# Patient Record
Sex: Female | Born: 1975 | Race: Black or African American | Hispanic: No | Marital: Married | Smoking: Current every day smoker
Health system: Southern US, Community
[De-identification: ages and names within clinical notes are randomized; demographics above are authoritative.]

## PROBLEM LIST (undated history)

## (undated) DIAGNOSIS — Z789 Other specified health status: Secondary | ICD-10-CM

## (undated) DIAGNOSIS — I10 Essential (primary) hypertension: Secondary | ICD-10-CM

## (undated) DIAGNOSIS — O1490 Unspecified pre-eclampsia, unspecified trimester: Secondary | ICD-10-CM

## (undated) HISTORY — PX: SALPINGECTOMY: SHX328

## (undated) HISTORY — PX: TUBAL LIGATION: SHX77

---

## 2001-05-13 ENCOUNTER — Other Ambulatory Visit: Admission: RE | Admit: 2001-05-13 | Discharge: 2001-05-13 | Payer: Self-pay | Admitting: Obstetrics and Gynecology

## 2001-05-22 HISTORY — PX: BREAST SURGERY: SHX581

## 2001-08-05 ENCOUNTER — Encounter (INDEPENDENT_AMBULATORY_CARE_PROVIDER_SITE_OTHER): Payer: Self-pay | Admitting: *Deleted

## 2001-08-05 ENCOUNTER — Ambulatory Visit (HOSPITAL_BASED_OUTPATIENT_CLINIC_OR_DEPARTMENT_OTHER): Admission: RE | Admit: 2001-08-05 | Discharge: 2001-08-06 | Payer: Self-pay | Admitting: Specialist

## 2003-09-28 ENCOUNTER — Other Ambulatory Visit: Admission: RE | Admit: 2003-09-28 | Discharge: 2003-09-28 | Payer: Self-pay | Admitting: Obstetrics and Gynecology

## 2003-10-07 ENCOUNTER — Encounter: Admission: RE | Admit: 2003-10-07 | Discharge: 2003-10-07 | Payer: Self-pay | Admitting: Obstetrics and Gynecology

## 2004-01-13 ENCOUNTER — Inpatient Hospital Stay (HOSPITAL_COMMUNITY): Admission: AD | Admit: 2004-01-13 | Discharge: 2004-01-13 | Payer: Self-pay | Admitting: *Deleted

## 2004-02-08 ENCOUNTER — Inpatient Hospital Stay (HOSPITAL_COMMUNITY): Admission: AD | Admit: 2004-02-08 | Discharge: 2004-02-10 | Payer: Self-pay | Admitting: Obstetrics and Gynecology

## 2004-02-15 ENCOUNTER — Observation Stay (HOSPITAL_COMMUNITY): Admission: AD | Admit: 2004-02-15 | Discharge: 2004-02-16 | Payer: Self-pay | Admitting: Obstetrics and Gynecology

## 2004-02-23 ENCOUNTER — Encounter: Admission: RE | Admit: 2004-02-23 | Discharge: 2004-02-23 | Payer: Self-pay | Admitting: Obstetrics and Gynecology

## 2004-02-23 ENCOUNTER — Inpatient Hospital Stay (HOSPITAL_COMMUNITY): Admission: AD | Admit: 2004-02-23 | Discharge: 2004-02-23 | Payer: Self-pay | Admitting: Obstetrics and Gynecology

## 2004-03-29 ENCOUNTER — Inpatient Hospital Stay (HOSPITAL_COMMUNITY): Admission: AD | Admit: 2004-03-29 | Discharge: 2004-03-31 | Payer: Self-pay | Admitting: Obstetrics and Gynecology

## 2004-04-09 ENCOUNTER — Inpatient Hospital Stay (HOSPITAL_COMMUNITY): Admission: AD | Admit: 2004-04-09 | Discharge: 2004-04-09 | Payer: Self-pay | Admitting: Obstetrics and Gynecology

## 2006-02-07 ENCOUNTER — Emergency Department (HOSPITAL_COMMUNITY): Admission: EM | Admit: 2006-02-07 | Discharge: 2006-02-07 | Payer: Self-pay | Admitting: *Deleted

## 2006-03-04 ENCOUNTER — Emergency Department (HOSPITAL_COMMUNITY): Admission: EM | Admit: 2006-03-04 | Discharge: 2006-03-04 | Payer: Self-pay | Admitting: Emergency Medicine

## 2008-11-05 ENCOUNTER — Inpatient Hospital Stay (HOSPITAL_COMMUNITY): Admission: AD | Admit: 2008-11-05 | Discharge: 2008-11-05 | Payer: Self-pay | Admitting: Obstetrics & Gynecology

## 2008-11-07 ENCOUNTER — Inpatient Hospital Stay (HOSPITAL_COMMUNITY): Admission: AD | Admit: 2008-11-07 | Discharge: 2008-11-07 | Payer: Self-pay | Admitting: Obstetrics & Gynecology

## 2008-11-13 ENCOUNTER — Inpatient Hospital Stay (HOSPITAL_COMMUNITY): Admission: RE | Admit: 2008-11-13 | Discharge: 2008-11-13 | Payer: Self-pay | Admitting: Obstetrics & Gynecology

## 2008-11-13 ENCOUNTER — Ambulatory Visit: Payer: Self-pay | Admitting: Obstetrics and Gynecology

## 2008-11-16 ENCOUNTER — Inpatient Hospital Stay (HOSPITAL_COMMUNITY): Admission: AD | Admit: 2008-11-16 | Discharge: 2008-11-16 | Payer: Self-pay | Admitting: Obstetrics & Gynecology

## 2008-11-19 ENCOUNTER — Encounter: Payer: Self-pay | Admitting: Obstetrics & Gynecology

## 2008-11-19 ENCOUNTER — Ambulatory Visit: Payer: Self-pay | Admitting: Obstetrics & Gynecology

## 2008-11-19 ENCOUNTER — Ambulatory Visit (HOSPITAL_COMMUNITY): Admission: AD | Admit: 2008-11-19 | Discharge: 2008-11-19 | Payer: Self-pay | Admitting: Obstetrics & Gynecology

## 2008-12-10 ENCOUNTER — Ambulatory Visit: Payer: Self-pay | Admitting: Obstetrics and Gynecology

## 2009-03-24 ENCOUNTER — Ambulatory Visit (HOSPITAL_COMMUNITY): Admission: RE | Admit: 2009-03-24 | Discharge: 2009-03-24 | Payer: Self-pay | Admitting: Obstetrics

## 2009-04-23 ENCOUNTER — Ambulatory Visit (HOSPITAL_COMMUNITY): Admission: RE | Admit: 2009-04-23 | Discharge: 2009-04-23 | Payer: Self-pay | Admitting: Obstetrics & Gynecology

## 2009-05-19 ENCOUNTER — Encounter: Admission: RE | Admit: 2009-05-19 | Discharge: 2009-08-17 | Payer: Self-pay | Admitting: Obstetrics

## 2009-05-20 ENCOUNTER — Ambulatory Visit (HOSPITAL_COMMUNITY): Admission: RE | Admit: 2009-05-20 | Discharge: 2009-05-20 | Payer: Self-pay | Admitting: Obstetrics & Gynecology

## 2009-06-04 ENCOUNTER — Ambulatory Visit (HOSPITAL_COMMUNITY): Admission: RE | Admit: 2009-06-04 | Discharge: 2009-06-04 | Payer: Self-pay | Admitting: Obstetrics & Gynecology

## 2009-07-16 ENCOUNTER — Ambulatory Visit (HOSPITAL_COMMUNITY): Admission: RE | Admit: 2009-07-16 | Discharge: 2009-07-16 | Payer: Self-pay | Admitting: Obstetrics & Gynecology

## 2009-08-13 ENCOUNTER — Ambulatory Visit (HOSPITAL_COMMUNITY): Admission: RE | Admit: 2009-08-13 | Discharge: 2009-08-13 | Payer: Self-pay | Admitting: Obstetrics & Gynecology

## 2009-09-04 ENCOUNTER — Inpatient Hospital Stay (HOSPITAL_COMMUNITY): Admission: AD | Admit: 2009-09-04 | Discharge: 2009-09-04 | Payer: Self-pay | Admitting: Obstetrics

## 2009-09-09 ENCOUNTER — Ambulatory Visit (HOSPITAL_COMMUNITY): Admission: RE | Admit: 2009-09-09 | Discharge: 2009-09-09 | Payer: Self-pay | Admitting: Obstetrics & Gynecology

## 2009-10-07 ENCOUNTER — Ambulatory Visit (HOSPITAL_COMMUNITY): Admission: RE | Admit: 2009-10-07 | Discharge: 2009-10-07 | Payer: Self-pay | Admitting: Obstetrics & Gynecology

## 2009-10-10 ENCOUNTER — Inpatient Hospital Stay (HOSPITAL_COMMUNITY): Admission: AD | Admit: 2009-10-10 | Discharge: 2009-10-10 | Payer: Self-pay | Admitting: Obstetrics & Gynecology

## 2009-10-12 ENCOUNTER — Ambulatory Visit: Payer: Self-pay | Admitting: Family Medicine

## 2009-10-13 ENCOUNTER — Encounter: Payer: Self-pay | Admitting: Family Medicine

## 2009-10-13 LAB — CONVERTED CEMR LAB: Chlamydia, DNA Probe: NEGATIVE

## 2009-10-20 ENCOUNTER — Ambulatory Visit: Payer: Self-pay | Admitting: Obstetrics & Gynecology

## 2009-10-22 ENCOUNTER — Ambulatory Visit: Payer: Self-pay | Admitting: Obstetrics and Gynecology

## 2009-10-23 ENCOUNTER — Inpatient Hospital Stay (HOSPITAL_COMMUNITY): Admission: AD | Admit: 2009-10-23 | Discharge: 2009-10-23 | Payer: Self-pay | Admitting: Obstetrics and Gynecology

## 2009-10-23 ENCOUNTER — Ambulatory Visit: Payer: Self-pay | Admitting: Advanced Practice Midwife

## 2009-10-25 ENCOUNTER — Ambulatory Visit: Payer: Self-pay | Admitting: Obstetrics & Gynecology

## 2009-10-27 ENCOUNTER — Inpatient Hospital Stay (HOSPITAL_COMMUNITY): Admission: RE | Admit: 2009-10-27 | Discharge: 2009-10-29 | Payer: Self-pay | Admitting: Obstetrics & Gynecology

## 2009-10-27 ENCOUNTER — Ambulatory Visit: Payer: Self-pay | Admitting: Advanced Practice Midwife

## 2009-12-21 ENCOUNTER — Ambulatory Visit: Payer: Self-pay | Admitting: Obstetrics & Gynecology

## 2010-03-25 ENCOUNTER — Ambulatory Visit: Payer: Self-pay | Admitting: Obstetrics & Gynecology

## 2010-03-25 ENCOUNTER — Ambulatory Visit (HOSPITAL_COMMUNITY)
Admission: RE | Admit: 2010-03-25 | Discharge: 2010-03-25 | Payer: Self-pay | Source: Home / Self Care | Admitting: Obstetrics & Gynecology

## 2010-06-12 ENCOUNTER — Encounter: Payer: Self-pay | Admitting: Obstetrics

## 2010-08-03 LAB — CBC
Hemoglobin: 12.9 g/dL (ref 12.0–15.0)
MCHC: 33.5 g/dL (ref 30.0–36.0)
RBC: 4.76 MIL/uL (ref 3.87–5.11)

## 2010-08-03 LAB — SURGICAL PCR SCREEN
MRSA, PCR: NEGATIVE
Staphylococcus aureus: NEGATIVE

## 2010-08-08 LAB — GLUCOSE, CAPILLARY
Glucose-Capillary: 144 mg/dL — ABNORMAL HIGH (ref 70–99)
Glucose-Capillary: 80 mg/dL (ref 70–99)
Glucose-Capillary: 92 mg/dL (ref 70–99)

## 2010-08-08 LAB — CBC
HCT: 35.5 % — ABNORMAL LOW (ref 36.0–46.0)
Hemoglobin: 12.2 g/dL (ref 12.0–15.0)
MCHC: 34.4 g/dL (ref 30.0–36.0)
MCV: 79.1 fL (ref 78.0–100.0)
RBC: 4.49 MIL/uL (ref 3.87–5.11)

## 2010-08-09 LAB — GLUCOSE, CAPILLARY: Glucose-Capillary: 92 mg/dL (ref 70–99)

## 2010-08-09 LAB — URINALYSIS, ROUTINE W REFLEX MICROSCOPIC
Glucose, UA: NEGATIVE mg/dL
Leukocytes, UA: NEGATIVE
Nitrite: NEGATIVE
Protein, ur: NEGATIVE mg/dL
pH: 6 (ref 5.0–8.0)

## 2010-08-09 LAB — URINE MICROSCOPIC-ADD ON

## 2010-08-28 LAB — CBC
HCT: 34.8 % — ABNORMAL LOW (ref 36.0–46.0)
Hemoglobin: 12 g/dL (ref 12.0–15.0)
RBC: 4.32 MIL/uL (ref 3.87–5.11)
RDW: 13.1 % (ref 11.5–15.5)

## 2010-08-28 LAB — HCG, QUANTITATIVE, PREGNANCY: hCG, Beta Chain, Quant, S: 6218 m[IU]/mL — ABNORMAL HIGH (ref <5)

## 2010-08-28 LAB — TYPE AND SCREEN: Antibody Screen: NEGATIVE

## 2010-08-28 LAB — ABO/RH: ABO/RH(D): O POS

## 2010-08-29 LAB — COMPREHENSIVE METABOLIC PANEL
AST: 23 U/L (ref 0–37)
Albumin: 3.6 g/dL (ref 3.5–5.2)
Chloride: 105 mEq/L (ref 96–112)
Creatinine, Ser: 0.61 mg/dL (ref 0.4–1.2)
GFR calc Af Amer: >60 mL/min (ref >60)
Potassium: 3.6 mEq/L (ref 3.5–5.1)
Total Bilirubin: 0.4 mg/dL (ref 0.3–1.2)
Total Protein: 7.4 g/dL (ref 6.0–8.3)

## 2010-08-29 LAB — URINALYSIS, ROUTINE W REFLEX MICROSCOPIC
Bilirubin Urine: NEGATIVE
Ketones, ur: NEGATIVE mg/dL
Protein, ur: NEGATIVE mg/dL
Urobilinogen, UA: 0.2 mg/dL (ref 0.0–1.0)

## 2010-08-29 LAB — URINE CULTURE: Colony Count: 1000

## 2010-08-29 LAB — CBC
Hemoglobin: 13 g/dL (ref 12.0–15.0)
MCHC: 34.2 g/dL (ref 30.0–36.0)
MCV: 80.9 fL (ref 78.0–100.0)
Platelets: 307 10*3/uL (ref 150–400)
RBC: 4.72 MIL/uL (ref 3.87–5.11)
RDW: 12.9 % (ref 11.5–15.5)
WBC: 11.3 10*3/uL — ABNORMAL HIGH (ref 4.0–10.5)
WBC: 9.9 10*3/uL (ref 4.0–10.5)

## 2010-08-29 LAB — DIFFERENTIAL
Basophils Absolute: 0.2 10*3/uL — ABNORMAL HIGH (ref 0.0–0.1)
Eosinophils Relative: 2 % (ref 0–5)
Lymphocytes Relative: 22 % (ref 12–46)
Monocytes Absolute: 0.8 10*3/uL (ref 0.1–1.0)
Monocytes Relative: 7 % (ref 3–12)

## 2010-08-29 LAB — WET PREP, GENITAL

## 2010-08-29 LAB — HCG, QUANTITATIVE, PREGNANCY: hCG, Beta Chain, Quant, S: 4922 m[IU]/mL — ABNORMAL HIGH (ref <5)

## 2010-10-04 NOTE — Group Therapy Note (Signed)
[name], BOULET NO.:  [id number]   MEDICAL RECORD NO.:  [id number]          PATIENT TYPE:  WOC   LOCATION:  [facility]                   FACILITY:  [facility]   PHYSICIAN:  Scheryl Darter, MD       DATE OF BIRTH:  [birth year]   DATE OF SERVICE:                                  CLINIC NOTE   The patient comes for followup after a laparoscopy and left  salpingectomy for ruptured left tubal ectopic pregnancy on November 19, 2008.  Procedure was performed by Dr. Macon Large.  Pathology shows a luminal  chorionic villi consistent with tubal ectopic pregnancy on the left.  The patient had had a Mirena in place for 4 years.  She had previously  been treated with methotrexate for ectopic pregnancy.  The IUD was  removed at the time of the procedure.  It was found to have been  embedded in the cervical canal.  She would like to be on oral  contraceptives now for birth control.  She occasionally smokes a  cigarette.  No history of venous thrombosis.   PHYSICAL EXAMINATION:  GENERAL:  The patient is in no acute distress  with normal affect.  ABDOMEN:  Moderately obese with well healing incisions at the umbilicus  in the left lower quadrant and the abdomen is nontender.  PELVIC:  Deferred.   IMPRESSION:  The patient is doing well after surgery for left tubal  ectopic pregnancy.   PLAN:  Gave prescription for Ortho Tri-Cyclen Lo.  She may follow up at  [facility] after this.      Scheryl Darter, MD     JA/MEDQ  D:  12/10/2008  T:  12/11/2008  Job:  [id number]

## 2010-10-04 NOTE — Op Note (Signed)
[name], [name]               ACCOUNT NO.:  [id number]   MEDICAL RECORD NO.:  [id number]          PATIENT TYPE:  AMB   LOCATION:  [facility]                          [facility]   PHYSICIAN:  Horton Chin, MD DATE OF BIRTH:  March 10, 1976   DATE OF PROCEDURE:  11/19/2008  DATE OF DISCHARGE:  11/19/2008                               OPERATIVE REPORT   PREOPERATIVE DIAGNOSIS:  Ectopic pregnancy, responsive to methotrexate  therapy, retained intrauterine device.   POSTOPERATIVE DIAGNOSIS:  Ruptured ectopic pregnancy in left fallopian  tube, removed intrauterine device.   PROCEDURE:  Laparoscopic right salpingectomy.   SURGEON:  Horton Chin, MD   ASSISTANT:  Carmin Richmond, DO   ANESTHESIA:  General.   IV FLUIDS:  2600 mL.   ESTIMATED BLOOD LOSS:  Minimal during procedure.  Of note,  hemoperitoneum was about 150 mL.   FINDINGS:  Ruptured left fallopian tube with ectopic pregnancy, normal  uterus, and right adnexa.   SPECIMENS:  Left fallopian tube and ectopic pregnancy sent to pathology.   COMPLICATIONS:  None immediate.   PROCEDURE IN DETAIL:  The patient was a 35 year old gravida 6, para 1-0-  4-1 with known ectopic pregnancy who was treated with methotrexate on  November 13, 2008.  The patient demonstrated an increase in her hCG from  4922 on day one to a value of 6218 on day seven.  She also presented on  day 7 with severe abdominal pain and an ultrasound showed an enlarged  left ectopic pregnancy that increased from 1.2 cm on day 1 to about 3.5  cm and some complex fluid in the pelvis.  Given the patient's symptoms,  she was counseled regarding surgical management and the risks of surgery  including bleeding, infection, injury to surrounding organs, need for  additional procedures including laparotomy were explained to the patient  and written informed consent was obtained.  She was taken from the MAU  to the operating room where general anesthesia was  administered and  found to be adequate.  The patient was then placed in a dorsal lithotomy  position and prepped and draped in a sterile manner.  Her bladder was  catheterized for an unmeasured amount of clear yellow urine.  Of note,  the patient had a retained Mirena IUD in place and on ultrasound, it was  noted to be in her cervix embedded in the cervical canal.  While the  patient was being prepped and draped in a sterile manner,  the IUD  string was easily visualized in the cervical canal and was removed at  this point during the prep.  No additional procedures needed to be done  to remove the IUD at this point and it was removed intact.  The uterine  manipulator was then placed at this point.  Attention was then turned to  the patient's abdomen where an umbilical incision was made and a Veress  needle was inserted into the peritoneum.  Correct intraperitoneal  placement was observed upon insufflation with carbon dioxide gas with an  opening pressure of 3 mmHg.  The abdomen was insufflated  to 15 mmHg, and  the Veress needle was removed.  A 10-mm trocar was then placed through  an umbilical site and the laparoscope was placed.  With the laparoscope,  a complete survey of the abdomen was done and left ruptured ectopic  pregnancy was noted.  At this point, 2 left lower quadrant ports were  placed; one was 10 mm and the other one was a 5 mm.  Through these  ports, a grasper and the gyrus instrument were used to isolate the left  fallopian tube containing the ectopic pregnancy and the gyrus instrument  was used to lyse the attachment of the tube to the uterus and the  underlying mesosalpinx.  Small amount of bleeding was observed and was  controlled using the gyrus instrument.  The pelvis was then copiously  irrigated with normal saline and hemostasis was reconfirmed on all  surfaces.  The ectopic pregnancy and the fallopian tube were removed  from the abdomen intact using an EndoCatch bag.   Hemostasis was then  confirmed on all surfaces and the abdomen was desufflated.  The 10-mm  fascial sites were reapproximated using 0 Vicryl figure-of-eight  stitches and all skin incisions were closed using 4-0 Vicryl  subcuticular stitches.  The patient tolerated the procedure well.  Sponge, needle, and instrument counts were correct x3.  She was taken to  the recovery room in stable condition.      Horton Chin, MD  Electronically Signed     UAA/MEDQ  D:  11/19/2008  T:  11/20/2008  Job:  [id number]

## 2010-10-07 NOTE — Discharge Summary (Signed)
[name], [name]               ACCOUNT NO.:  [id number]   MEDICAL RECORD NO.:  [id number]          PATIENT TYPE:  INP   LOCATION:  9155                           FACILITY:   PHYSICIAN:  Osborn Coho, M.D.   DATE OF BIRTH:  Oct 20, 1975   DATE OF ADMISSION:  02/15/2004  DATE OF DISCHARGE:  02/16/2004                                 DISCHARGE SUMMARY   ADMISSION DIAGNOSES:  1.  Intrauterine pregnancy at 32-1/7 weeks.  2.  Gestational diabetes.  3.  Obesity.  4.  History of preterm uterine contractions.   DISCHARGE DIAGNOSES:  1.  Leaving Against Medical Advice.  2.  Poor diabetic control.   PROCEDURE:  This admission are none.   HOSPITAL COURSE:  Ms. [name] is a 35 year old black female, gravida 2, para  0-0-1-0 at 32-1/7 weeks who presented to the office yesterday for her  routine exam and was noted to have glycosuria whereupon a random blood sugar  was done that was greater than 300.  She was then admitted to [facility] for diabetic teaching and glucose management and insulin  management.  She was started on four units of NPH at night and 2 units of  NPH in the a.m. and her fasting blood sugar this morning was 127.  Her two  hours PC's have varied between 119 and 205 and her two hours after breakfast  today was 122.  She has been recommended to continue her hospitalization  until the sugars are more adequately controlled; however, the patient states  that she must leave the hospital to take care of some issues at home and  will not stay but has agreed to stay for further teaching.  The plan then is  that she be discharged home on insulin therapy and to continue to give  herself NPH 6 units at bedtime and 2 units every morning of NPH and she is  currently being instructed regarding the administration of insulin.   DISCHARGE INSTRUCTIONS:  She is to call for signs or symptoms of preterm  labor, decreased fetal movement or any issues regarding her sugars.   DISCHARGE  MEDICATIONS:  1.  NPH insulin 6 units q h.s. and NPH 2 units q.a.m.  2.  Prenatal vitamins daily.   DISCHARGE LABS:  Her urinalysis was negative except for 500 glucose and a  small amount of LE.  Her comprehensive metabolic panel is essentially within  normal limits and her CBC is with a hemoglobin of 12.1, WBC count of 13.1,  and platelets 254.  TSH was 1.79.   DISCHARGE FOLLOW UP:  Will be on Friday for an NST and then Tuesdays and  Fridays will be for NSTs subsequently with a visit with a medical doctor on  Tuesday.   DISCHARGE STATUS:  Well and relatively stable although not recommended for  discharge.      SJD/MEDQ  D:  02/16/2004  T:  02/16/2004  Job:  [id number]

## 2010-10-07 NOTE — H&P (Signed)
[name], [name]               ACCOUNT NO.:  [id number]   MEDICAL RECORD NO.:  [id number]          PATIENT TYPE:  INP   LOCATION:  9143                          [facility]   PHYSICIAN:  Osborn Coho, M.D.   DATE OF BIRTH:  [birth year]   DATE OF ADMISSION:  03/29/2004  DATE OF DISCHARGE:                                HISTORY & PHYSICAL   Ms. [name] is a 35 year old, single, black female, gravida 2, para 0-0-1-0  at 47 3/7 weeks who presented to maternity admissions by EMS complaining of  uterine contractions since about 5 a.m. after her water broke. She reported  clear fluid upon noting her water to be broken. She reported she had been  about 3-4 cm late in her pregnancy and when she presented she was found to  be completed dilated. She denied any nausea, vomiting, headache or visual  disturbances.  Her pregnancy has been followed by [facility],  initially by the Certified Nurse Midwife Service  but transferred to the MD  service secondary to gestational diabetes requiring insulin.  She has been  under moderately good control with NPH at bedtime.  Her other pregnancy  complications were a history of positive group B strep in her urine with  this pregnancy, history of abnormal Pap.   OB/GYN HISTORY:  She is a gravida 2, para 0-0-1-0 with an elective AB in  December of 2004 without complications.  Her LMP for this pregnancy was  February of 2005 giving her an [facility] of April 14, 2004 and by ultrasound  November 19.  She has had Depo-Provera shots, the last one was in 2000. She  had an abnormal Pap in 1999 and had a colposcopy and her last Pap was in  2002 and was within normal limits.  She had a history of chlamydia and  trichomonas in the past both of which have been treated.   PAST MEDICAL HISTORY:  She has no known drug allergies. She reports having  had the usual childhood diseases. She reports a history of occasional  urinary tract infections and she  reported smoking prior to this pregnancy.   PAST SURGICAL HISTORY:  Breast reduction in March of 2003 and [facility] in  December of 2004.   FAMILY HISTORY:  Significant for aunt with congestive heart failure, mother  with hypertension and several aunts with hypertension.  Mother with anemia  and diet controlled diabetes. The patient's aunt with kidney failure on  dialysis.  Mother with breast cancer and mother and aunt with bipolar  disorder.   SOCIAL HISTORY:  She is single, the father of the baby is [employer].  She is of the [place] faith.  She denies any illicit drug use, alcohol or  smoking since known she was pregnant. She is employed Systems developer as a Psychiatrist.   PRENATAL LABS:  Her blood type is O positive, her antibody screen is  negative, sickle cell trait is negative, toxotiters are negative, syphilis  is nonreactive, rubella is immune. Hepatitis B surface antigen is negative.  HIV is nonreactive, toxotiters were negative.  Cystic fibrosis was negative,  Pap was within normal limits.  Her one hour Glucola was 95, however, she  subsequently developed glycosuria and had a Dextrostix of 157 and then at 32  weeks had a Dextrostix of 318 for which she was hospitalized and started on  insulin therapy to normalize her blood sugars and was transferred to the MD  service.  Her blood sugars remained stable with occasional dietary  indiscretions causing elevations.   PHYSICAL EXAMINATION:  VITAL SIGNS:  Stable. She is afebrile.  HEENT:  Grossly within normal limits.  HEART:  Regular rate and rhythm.  CHEST:  Clear.  BREASTS:  Soft and nontender.  ABDOMEN:  Gravida with contractions every 2-3 minutes upon arrival. Fetal  heart rate is reassuring.  PELVIC:  Reveals complete dilatation and spontaneous rupture of the fluid,  initially clear but noted to be meconium stain at time of delivery.  EXTREMITIES:  Within normal limits.   ASSESSMENT:  1.  Intrauterine  pregnancy at 38 weeks.  2.  Gestational diabetes controlled with insulin.  3.  Second stage of labor.   PLAN:  Admit to labor and delivery and anticipate eminent delivery.  Dr.  Su Hilt is notified of the patient's admission.     Shel   SJD/MEDQ  D:  03/29/2004  T:  03/29/2004  Job:  [id number]
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
## 2010-10-07 NOTE — Op Note (Signed)
[place]  Patient:    [name], [name] Visit Number: [id number] MRN: [id number]          Service Type: DSU Location: [facility] Attending Physician:  Gustavus Messing Dictated by:   Yaakov Guthrie. Shon Hough, M.D. Proc. Date: 08/05/01 Admit Date:  08/05/2001                             Operative Report  INDICATIONS FOR PROCEDURE: This is a 35 year old lady, with severe, severe macromastia, back and shoulder pain secondary to large pendulous breast.  She has had increased pain, discomfort, kyphosis of her neck, with pitting of both shoulder areas and intertrigo.  She also has increased rubbing and irritation of the right and left axillary regions causing intertriginous changes there as well.  OPERATION/PROCEDURE: Bilateral breast reduction using inferior pedicle technique with reduction of right and left accessory breast tissue, right breast greater than left.  SURGEON: Yaakov Guthrie. Shon Hough, M.D.  ASSISTANT: Margaretha Sheffield, R.N.  DESCRIPTION OF PROCEDURE: The patient was taken to the operating room and placed on the operating room table in the supine position.  Was given adequate general anesthesia and intubated orally.  Prep was done to the chest and breast areas in routine fashion using Betadine soap and solution and walled off with sterile towels and drapes so as to make a sterile field.  Xylocaine 0.25% with epinephrine was injected locally, a total of 1:400,000 concentration 200 cc per side.  The wounds were scored with #10 blades, then the skin of the inferior pericardial was de-epithelialized with #20 blades. The medial and lateral fatty dermal pedicles were incised down to underlying fascia and after proper hemostasis a new keyhole area was also debulked.  Out laterally more tissue was removed directly using the Bovie unit on coagulation as well as accessory breast tissue in the latissimus dorsi and serratus anterior junction as well as  upper axillary areas.  After proper hemostasis we were able to then liposuction in the right and left axillary regions to remove more excessive breast tissue, accessory type, and after being happy with this the wounds were transposed and stayed with 3-0 Prolene.  Subcutaneous closure was done with 3-0 Monocryl x2 layers and a running subcuticular suture of 3-0 Monocryl and 5-0 Monocryl throughout the inverted T.  The wounds were drained with #10 Blake drains, which were placed in the depths of the wound and brought out through the lateral-most portion of the incisions and secured with 3-0 Prolene.  The wounds were cleansed.  Steri-Strips and soft dressings were applied to all the areas.  She withstood the procedure very well and was taken to recovery.  ESTIMATED BLOOD LOSS: Less than 200 cc.  COMPLICATIONS: None.  We were able to remove over 778 g on the right side, 1484 g on the left.  She was taken to recovery in excellent condition. Dictated by:   Yaakov Guthrie. Shon Hough, M.D. Attending Physician:  Gustavus Messing DD:  08/05/01 TD:  08/06/01 Job: [id number]
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
## 2010-10-07 NOTE — Discharge Summary (Signed)
NAMEMARLIN, [name]               ACCOUNT NO.:  [id number]   MEDICAL RECORD NO.:  [id number]          PATIENT TYPE:  INP   LOCATION:  9163                          FACILITY:  [facility]   PHYSICIAN:  Naima A. Dillard, M.D. DATE OF BIRTH:  [birth year]   DATE OF ADMISSION:  02/08/2004  DATE OF DISCHARGE:  02/10/2004                                 DISCHARGE SUMMARY   ADMITTING DIAGNOSES:  1.  Intrauterine pregnancy at 81 and two-sevenths weeks.  2.  Preterm labor.   DISCHARGE DIAGNOSES:  1.  Intrauterine pregnancy at 35 and two-sevenths weeks.  2.  Preterm labor.  3.  Stable preterm labor at 31 and five-sevenths weeks, stable.   [name] is a 35 year old gravida 2 para 0-0-1-0 who presented at 63 and  two-sevenths weeks by 11-week ultrasound.  She presented with vaginal  bleeding.  She had a small amount of blood noted in the vault but no active  bleeding.  However, she was noted to be having uterine contractions on the  monitor and uterine irritability.  Her cervix was noted to be 1-2 cm, 50%,  vertex, -2.  She was therefore admitted for preterm labor, to be medicated  with terbutaline subcu, and receive betamethasone two doses.  Uterine  irritability was noted to be continuous even following medication with  terbutaline.  Therefore, decision was made to admit the patient to begin  tocolysis with magnesium sulfate.  Her contractions ceased with magnesium  and remained without uterine activity even after magnesium was discontinued.  Fetal heart rate was reassuring throughout the patient's admission with no  decelerations.  The patient's vital signs remained stable and she had no  further cervical change.  She was therefore discharged home with terbutaline  p.r.n., preterm labor precautions were given, and she is to follow up in the  office of [facility] in 1 week.  The patient had a fetal fibronectin which was  done on February 05, 2004 and found to be negative.  Group B strep culture  ws  negative.  Her PIH labs and chemistries were all within normal limits.  Her hemoglobin was 11.5 and on September 21 the patient was entirely stable  for discharge.      SDM/MEDQ  D:  02/10/2004  T:  02/11/2004  Job:  [id number]

## 2011-05-10 ENCOUNTER — Other Ambulatory Visit: Payer: Self-pay | Admitting: Internal Medicine

## 2011-05-10 DIAGNOSIS — N6320 Unspecified lump in the left breast, unspecified quadrant: Secondary | ICD-10-CM

## 2011-05-24 ENCOUNTER — Ambulatory Visit: Payer: Self-pay

## 2011-05-29 ENCOUNTER — Other Ambulatory Visit: Payer: Self-pay

## 2011-07-27 ENCOUNTER — Inpatient Hospital Stay (HOSPITAL_COMMUNITY)
Admission: AD | Admit: 2011-07-27 | Discharge: 2011-07-27 | Disposition: A | Discharge status: Home / Self Care | Payer: Self-pay | Source: Ambulatory Visit | Attending: Obstetrics & Gynecology | Admitting: Obstetrics & Gynecology

## 2011-07-27 ENCOUNTER — Encounter (HOSPITAL_COMMUNITY): Payer: Self-pay

## 2011-07-27 ENCOUNTER — Telehealth: Payer: Self-pay | Admitting: Advanced Practice Midwife

## 2011-07-27 DIAGNOSIS — N644 Mastodynia: Secondary | ICD-10-CM | POA: Insufficient documentation

## 2011-07-27 HISTORY — DX: Other specified health status: Z78.9

## 2011-07-27 NOTE — ED Provider Notes (Signed)
History    64 [id number] with the following: Chief Complaint  Patient presents with  . Breast Pain   HPI Pt presents today with onset of pain in left breast described as pinching and sharp with onset this morning.  She reports a lump in her left breast which she noticed in October 2012 that is still there and has grown in size.  She has had some nausea today, but denies fever/chills, vomiting, or SOB.   Patient's last menstrual period was 07/01/2011.  She has had a breast reduction in 2003 and a BTL.  She denies vaginal, abdominal, or urinary symptoms.    OB History    Grav Para Term Preterm Abortions TAB SAB Ect Mult Living   3 2 2  0 1 0 0 1 0 2      Past Medical History  Diagnosis Date  . No pertinent past medical history     Past Surgical History  Procedure Date  . Salpingectomy   . Tubal ligation   . Breast surgery 2003    reduction    Family History  Problem Relation Age of Onset  . Anesthesia problems Neg Hx     History  Substance Use Topics  . Smoking status: Current Everyday Smoker    Types: Cigarettes  . Smokeless tobacco: Not on file  . Alcohol Use: No    Allergies: No Known Allergies  Prescriptions prior to admission  Medication Sig Dispense Refill  . Chlorphen-Pseudoephed-APAP (THERAFLU FLU/COLD PO) Take 1 packet by mouth daily as needed. For cold and flu like symptoms        Review of Systems  Constitutional: Negative for fever, chills and malaise/fatigue.  Eyes: Negative for blurred vision.  Respiratory: Negative for cough and shortness of breath.   Cardiovascular: Negative for chest pain.  Gastrointestinal: Negative for heartburn, nausea and vomiting.  Genitourinary: Negative for dysuria, urgency and frequency.  Musculoskeletal: Negative.   Neurological: Negative for dizziness and headaches.  Psychiatric/Behavioral: Negative for depression.   Physical Exam   Blood pressure 128/89, pulse 84, temperature 100 F (37.8 C), temperature source  Oral, resp. rate 20, height 5\' 5"  (1.651 m), weight 94.53 kg (208 lb 6.4 oz), last menstrual period 07/01/2011, SpO2 100.00%.  Physical Exam  Nursing note and vitals reviewed. Constitutional: She is oriented to person, place, and time. She appears well-developed and well-nourished.  Neck: Normal range of motion.  Cardiovascular: Normal rate.   Respiratory: Effort normal.  GI: Soft.  Musculoskeletal: Normal range of motion.  Neurological: She is alert and oriented to person, place, and time.  Skin: Skin is warm and dry.  Psychiatric: She has a normal mood and affect. Her behavior is normal. Judgment and thought content normal.    MAU Course  Procedures   Assessment and Plan  Pt left AMA prior to breast exam Discussed recommendation for pt to stay for exam and for referral for f/u diagnostics Pt plans to return after picking up her son  LEFTWICH-KIRBY, Prentis Langdon 07/27/2011, 11:52 AM

## 2011-07-27 NOTE — Telephone Encounter (Signed)
See telephone note and MAU notes from 07/27/11

## 2011-07-27 NOTE — Progress Notes (Signed)
Patient states that she has been having tightness over the left breast and felt a lump in October.

## 2011-07-27 NOTE — Progress Notes (Signed)
Pt states left breast is uncomfortable even when she's not touching it, it just feels "different". Breasts are uneven but she states they have been uneven since her breast reduction in 2003.

## 2011-07-27 NOTE — Progress Notes (Signed)
Pt walked out stating she had to go pick up her son and would come back later. Pt did not sign elopement form.

## 2011-10-29 ENCOUNTER — Encounter (HOSPITAL_COMMUNITY): Payer: Self-pay | Admitting: Emergency Medicine

## 2011-10-29 ENCOUNTER — Emergency Department (HOSPITAL_COMMUNITY)
Admission: EM | Admit: 2011-10-29 | Discharge: 2011-10-29 | Disposition: A | Discharge status: Home / Self Care | Payer: Self-pay | Attending: Emergency Medicine | Admitting: Emergency Medicine

## 2011-10-29 DIAGNOSIS — N390 Urinary tract infection, site not specified: Secondary | ICD-10-CM | POA: Insufficient documentation

## 2011-10-29 DIAGNOSIS — R200 Anesthesia of skin: Secondary | ICD-10-CM

## 2011-10-29 DIAGNOSIS — R209 Unspecified disturbances of skin sensation: Secondary | ICD-10-CM | POA: Insufficient documentation

## 2011-10-29 DIAGNOSIS — R110 Nausea: Secondary | ICD-10-CM | POA: Insufficient documentation

## 2011-10-29 HISTORY — DX: Essential (primary) hypertension: I10

## 2011-10-29 HISTORY — DX: Unspecified pre-eclampsia, unspecified trimester: O14.90

## 2011-10-29 LAB — DIFFERENTIAL
Basophils Relative: 1 % (ref 0–1)
Eosinophils Absolute: 0.5 10*3/uL (ref 0.0–0.7)
Eosinophils Relative: 5 % (ref 0–5)
Lymphs Abs: 3.7 10*3/uL (ref 0.7–4.0)
Monocytes Absolute: 0.6 10*3/uL (ref 0.1–1.0)
Neutro Abs: 5.6 10*3/uL (ref 1.7–7.7)
Neutrophils Relative %: 53 % (ref 43–77)

## 2011-10-29 LAB — CBC
HCT: 35.8 % — ABNORMAL LOW (ref 36.0–46.0)
Hemoglobin: 12.4 g/dL (ref 12.0–15.0)
MCH: 25.7 pg — ABNORMAL LOW (ref 26.0–34.0)
MCHC: 34.6 g/dL (ref 30.0–36.0)
MCV: 74.3 fL — ABNORMAL LOW (ref 78.0–100.0)
RBC: 4.82 MIL/uL (ref 3.87–5.11)

## 2011-10-29 LAB — BASIC METABOLIC PANEL
BUN: 6 mg/dL (ref 6–23)
CO2: 27 mEq/L (ref 19–32)
Calcium: 9.1 mg/dL (ref 8.4–10.5)
GFR calc non Af Amer: >90 mL/min (ref >90)
Glucose, Bld: 76 mg/dL (ref 70–99)

## 2011-10-29 LAB — URINALYSIS, ROUTINE W REFLEX MICROSCOPIC
Bilirubin Urine: NEGATIVE
Glucose, UA: NEGATIVE mg/dL
Hgb urine dipstick: NEGATIVE
Protein, ur: NEGATIVE mg/dL
Urobilinogen, UA: 1 mg/dL (ref 0.0–1.0)

## 2011-10-29 LAB — URINE MICROSCOPIC-ADD ON

## 2011-10-29 MED ORDER — METOCLOPRAMIDE HCL 5 MG/ML IJ SOLN: 10.0000 mg | Freq: Once | INTRAMUSCULAR | Status: DC

## 2011-10-29 MED ORDER — CEPHALEXIN 500 MG PO CAPS: 500.0000 mg | ORAL_CAPSULE | Freq: Three times a day (TID) | ORAL | Status: AC

## 2011-10-29 MED ORDER — CEPHALEXIN 250 MG PO CAPS
500.0000 mg | ORAL_CAPSULE | Freq: Once | ORAL | Status: AC
  Administered 2011-10-29: 500 mg via ORAL
  Filled 2011-10-29: qty 2

## 2011-10-29 MED ORDER — METOCLOPRAMIDE HCL 10 MG PO TABS: 10.0000 mg | ORAL_TABLET | Freq: Four times a day (QID) | ORAL | Status: AC | PRN

## 2011-10-29 MED ORDER — SODIUM CHLORIDE 0.9 % IV SOLN: Freq: Once | INTRAVENOUS | Status: DC

## 2011-10-29 MED ORDER — DIPHENHYDRAMINE HCL 50 MG/ML IJ SOLN
25.0000 mg | Freq: Once | INTRAMUSCULAR | Status: AC
  Administered 2011-10-29: 25 mg via INTRAMUSCULAR
  Filled 2011-10-29: qty 1

## 2011-10-29 MED ORDER — METOCLOPRAMIDE HCL 5 MG/ML IJ SOLN
10.0000 mg | Freq: Once | INTRAMUSCULAR | Status: AC
  Administered 2011-10-29: 10 mg via INTRAMUSCULAR
  Filled 2011-10-29: qty 2

## 2011-10-29 MED ORDER — DIPHENHYDRAMINE HCL 50 MG/ML IJ SOLN: 25.0000 mg | Freq: Once | INTRAMUSCULAR | Status: DC

## 2011-10-29 MED ORDER — HYDROCODONE-ACETAMINOPHEN 5-325 MG PO TABS: 1.0000 | ORAL_TABLET | ORAL | Status: AC | PRN

## 2011-10-29 NOTE — ED Notes (Signed)
Pharmacy verified that it is acceptable to give Reglan IM.

## 2011-10-29 NOTE — Discharge Instructions (Signed)
Return to the emergency department if symptoms are getting worse.  Urinary Tract Infection Infections of the urinary tract can start in several places. A bladder infection (cystitis), a kidney infection (pyelonephritis), and a prostate infection (prostatitis) are different types of urinary tract infections (UTIs). They usually get better if treated with medicines (antibiotics) that kill germs. Take all the medicine until it is gone. You or your child may feel better in a few days, but TAKE ALL MEDICINE or the infection may not respond and may become more difficult to treat. HOME CARE INSTRUCTIONS   Drink enough water and fluids to keep the urine clear or pale yellow. Cranberry juice is especially recommended, in addition to large amounts of water.   Avoid caffeine, tea, and carbonated beverages. They tend to irritate the bladder.   Alcohol may irritate the prostate.   Only take over-the-counter or prescription medicines for pain, discomfort, or fever as directed by your caregiver.  To prevent further infections:  Empty the bladder often. Avoid holding urine for long periods of time.   After a bowel movement, women should cleanse from front to back. Use each tissue only once.   Empty the bladder before and after sexual intercourse.  FINDING OUT THE RESULTS OF YOUR TEST Not all test results are available during your visit. If your or your child's test results are not back during the visit, make an appointment with your caregiver to find out the results. Do not assume everything is normal if you have not heard from your caregiver or the medical facility. It is important for you to follow up on all test results. SEEK MEDICAL CARE IF:   There is back pain.   Your baby is older than 3 months with a rectal temperature of 100.5 F (38.1 C) or higher for more than 1 day.   Your or your child's problems (symptoms) are no better in 3 days. Return sooner if you or your child is getting worse.  SEEK  IMMEDIATE MEDICAL CARE IF:   There is severe back pain or lower abdominal pain.   You or your child develops chills.   You have a fever.   Your baby is older than 3 months with a rectal temperature of 102 F (38.9 C) or higher.   Your baby is 61 months old or younger with a rectal temperature of 100.4 F (38 C) or higher.   There is nausea or vomiting.   There is continued burning or discomfort with urination.  MAKE SURE YOU:   Understand these instructions.   Will watch your condition.   Will get help right away if you are not doing well or get worse.  Document Released: 02/15/2005 Document Revised: 04/27/2011 Document Reviewed: 09/20/2006 [facility] Patient Information 2012 [place], [place].  Paresthesia Paresthesia is an abnormal burning or prickling sensation. This sensation is generally felt in the hands, arms, legs, or feet. However, it may occur in any part of the body. It is usually not painful. The feeling may be described as:  Tingling or numbness.   "Pins and needles."   Skin crawling.   Buzzing.   Limbs "falling asleep."   Itching.  Most people experience temporary (transient) paresthesia at some time in their lives. CAUSES  Paresthesia may occur when you breathe too quickly (hyperventilation). It can also occur without any apparent cause. Commonly, paresthesia occurs when pressure is placed on a nerve. The feeling quickly goes away once the pressure is removed. For some people, however, paresthesia is  a long-lasting (chronic) condition caused by an underlying disorder. The underlying disorder may be:  A traumatic, direct injury to nerves. Examples include a:   Broken (fractured) neck.   Fractured skull.   A disorder affecting the brain and spinal cord (central nervous system). Examples include:   Transverse myelitis.   Encephalitis.   Transient ischemic attack.   Multiple sclerosis.   Stroke.   Tumor or blood vessel problems, such as an  arteriovenous malformation pressing against the brain or spinal cord.   A condition that damages the peripheral nerves (peripheral neuropathy). Peripheral nerves are not part of the brain and spinal cord. These conditions include:   Diabetes.   Peripheral vascular disease.   Nerve entrapment syndromes, such as carpal tunnel syndrome.   Shingles.   Hypothyroidism.   Vitamin B12 deficiencies.   Alcoholism.   Heavy metal poisoning (lead, arsenic).   Rheumatoid arthritis.   Systemic lupus erythematosus.  DIAGNOSIS  Your caregiver will attempt to find the underlying cause of your paresthesia. Your caregiver may:  Take your medical history.   Perform a physical exam.   Order various lab tests.   Order imaging tests.  TREATMENT  Treatment for paresthesia depends on the underlying cause. HOME CARE INSTRUCTIONS  Avoid drinking alcohol.   You may consider massage or acupuncture to help relieve your symptoms.   Keep all follow-up appointments as directed by your caregiver.  SEEK IMMEDIATE MEDICAL CARE IF:   You feel weak.   You have trouble walking or moving.   You have problems with speech or vision.   You feel confused.   You cannot control your bladder or bowel movements.   You feel numbness after an injury.   You faint.   Your burning or prickling feeling gets worse when walking.   You have pain, cramps, or dizziness.   You develop a rash.  MAKE SURE YOU:  Understand these instructions.   Will watch your condition.   Will get help right away if you are not doing well or get worse.  Document Released: 04/28/2002 Document Revised: 04/27/2011 Document Reviewed: 01/27/2011 [facility] Patient Information 2012 [place], [place].  Metoclopramide tablets What is this medicine? METOCLOPRAMIDE (met oh kloe PRA mide) is used to treat the symptoms of gastroesophageal reflux disease (GERD) like heartburn. It is also used to treat people with slow emptying of the  stomach and intestinal tract. This medicine may be used for other purposes; ask your health care provider or pharmacist if you have questions. What should I tell my health care provider before I take this medicine? They need to know if you have any of these conditions: -breast cancer -depression -diabetes -heart failure -high blood pressure -kidney disease -liver disease -Parkinson's disease or a movement disorder -pheochromocytoma -seizures -stomach obstruction, bleeding, or perforation -an unusual or allergic reaction to metoclopramide, procainamide, sulfites, other medicines, foods, dyes, or preservatives -pregnant or trying to get pregnant -breast-feeding How should I use this medicine? Take this medicine by mouth with a glass of water. Follow the directions on the prescription label. Take this medicine on an empty stomach, about 30 minutes before eating. Take your doses at regular intervals. Do not take your medicine more often than directed. Do not stop taking except on the advice of your doctor or health care professional. A special MedGuide will be given to you by the pharmacist with each prescription and refill. Be sure to read this information carefully each time. Talk to your pediatrician regarding the use of  this medicine in children. Special care may be needed. Overdosage: If you think you have taken too much of this medicine contact a poison control center or emergency room at once. NOTE: This medicine is only for you. Do not share this medicine with others. What if I miss a dose? If you miss a dose, take it as soon as you can. If it is almost time for your next dose, take only that dose. Do not take double or extra doses. What may interact with this medicine? -acetaminophen -cyclosporine -digoxin -medicines for blood pressure -medicines for diabetes, including insulin -medicines for hay fever and other allergies -medicines for depression, especially an Monoamine  Oxidase Inhibitor (MAOI) -medicines for Parkinson's disease, like levodopa -medicines for sleep or for pain -tetracycline This list may not describe all possible interactions. Give your health care provider a list of all the medicines, herbs, non-prescription drugs, or dietary supplements you use. Also tell them if you smoke, drink alcohol, or use illegal drugs. Some items may interact with your medicine. What should I watch for while using this medicine? It may take a few weeks for your stomach condition to start to get better. However, do not take this medicine for longer than 12 weeks. The longer you take this medicine, and the more you take it, the greater your chances are of developing serious side effects. If you are an elderly patient, a female patient, or you have diabetes, you may be at an increased risk for side effects from this medicine. Contact your doctor immediately if you start having movements you cannot control such as lip smacking, rapid movements of the tongue, involuntary or uncontrollable movements of the eyes, head, arms and legs, or muscle twitches and spasms. Patients and their families should watch out for worsening depression or thoughts of suicide. Also watch out for any sudden or severe changes in feelings such as feeling anxious, agitated, panicky, irritable, hostile, aggressive, impulsive, severely restless, overly excited and hyperactive, or not being able to sleep. If this happens, especially at the beginning of treatment or after a change in dose, call your doctor. Do not treat yourself for high fever. Ask your doctor or health care professional for advice. You may get drowsy or dizzy. Do not drive, use machinery, or do anything that needs mental alertness until you know how this drug affects you. Do not stand or sit up quickly, especially if you are an older patient. This reduces the risk of dizzy or fainting spells. Alcohol can make you more drowsy and dizzy. Avoid  alcoholic drinks. What side effects may I notice from receiving this medicine? Side effects that you should report to your doctor or health care professional as soon as possible: -allergic reactions like skin rash, itching or hives, swelling of the face, lips, or tongue -abnormal production of milk in females -breast enlargement in both males and females -change in the way you walk -difficulty moving, speaking or swallowing -drooling, lip smacking, or rapid movements of the tongue -excessive sweating -fever -involuntary or uncontrollable movements of the eyes, head, arms and legs -irregular heartbeat or palpitations -muscle twitches and spasms -unusually weak or tired Side effects that usually do not require medical attention (report to your doctor or health care professional if they continue or are bothersome): -change in sex drive or performance -depressed mood -diarrhea -difficulty sleeping -headache -menstrual changes -restless or nervous This list may not describe all possible side effects. Call your doctor for medical advice about side effects. You may  report side effects to FDA at 1-800-FDA-1088. Where should I keep my medicine? Keep out of the reach of children. Store at room temperature between 20 and 25 degrees C (68 and 77 degrees F). Protect from light. Keep container tightly closed. Throw away any unused medicine after the expiration date. NOTE: This sheet is a summary. It may not cover all possible information. If you have questions about this medicine, talk to your doctor, pharmacist, or health care provider.  2012, [employer] Standard. (01/01/2008 4:30:05 PM)  Cephalexin tablets or capsules What is this medicine? CEPHALEXIN (sef a LEX in) is a cephalosporin antibiotic. It is used to treat certain kinds of bacterial infections It will not work for colds, flu, or other viral infections. This medicine may be used for other purposes; ask your health care provider or  pharmacist if you have questions. What should I tell my health care provider before I take this medicine? They need to know if you have any of these conditions: -kidney disease -stomach or intestine problems, especially colitis -an unusual or allergic reaction to cephalexin, other cephalosporins, penicillins, other antibiotics, medicines, foods, dyes or preservatives -pregnant or trying to get pregnant -breast-feeding How should I use this medicine? Take this medicine by mouth with a full glass of water. Follow the directions on the prescription label. This medicine can be taken with or without food. Take your medicine at regular intervals. Do not take your medicine more often than directed. Take all of your medicine as directed even if you think you are better. Do not skip doses or stop your medicine early. Talk to your pediatrician regarding the use of this medicine in children. While this drug may be prescribed for selected conditions, precautions do apply. Overdosage: If you think you have taken too much of this medicine contact a poison control center or emergency room at once. NOTE: This medicine is only for you. Do not share this medicine with others. What if I miss a dose? If you miss a dose, take it as soon as you can. If it is almost time for your next dose, take only that dose. Do not take double or extra doses. There should be at least 4 to 6 hours between doses. What may interact with this medicine? -probenecid -some other antibiotics This list may not describe all possible interactions. Give your health care provider a list of all the medicines, herbs, non-prescription drugs, or dietary supplements you use. Also tell them if you smoke, drink alcohol, or use illegal drugs. Some items may interact with your medicine. What should I watch for while using this medicine? Tell your doctor or health care professional if your symptoms do not begin to improve in a few days. Do not treat  diarrhea with over the counter products. Contact your doctor if you have diarrhea that lasts more than 2 days or if it is severe and watery. If you have diabetes, you may get a false-positive result for sugar in your urine. Check with your doctor or health care professional. What side effects may I notice from receiving this medicine? Side effects that you should report to your doctor or health care professional as soon as possible: -allergic reactions like skin rash, itching or hives, swelling of the face, lips, or tongue -breathing problems -pain or trouble passing urine -redness, blistering, peeling or loosening of the skin, including inside the mouth -severe or watery diarrhea -unusually weak or tired -yellowing of the eyes, skin Side effects that usually do not require  medical attention (report to your doctor or health care professional if they continue or are bothersome): -gas or heartburn -genital or anal irritation -headache -joint or muscle pain -nausea, vomiting This list may not describe all possible side effects. Call your doctor for medical advice about side effects. You may report side effects to FDA at 1-800-FDA-1088. Where should I keep my medicine? Keep out of the reach of children. Store at room temperature between 59 and 86 degrees F (15 and 30 degrees C). Throw away any unused medicine after the expiration date. NOTE: This sheet is a summary. It may not cover all possible information. If you have questions about this medicine, talk to your doctor, pharmacist, or health care provider.  2012, [employer]vier/Gold Standard. (08/12/2007 5:09:13 PM)

## 2011-10-29 NOTE — ED Provider Notes (Signed)
History     CSN: [id number]  Arrival date & time 10/29/11  1108   First MD Initiated Contact with Patient 10/29/11 1238      Chief Complaint  Patient presents with  . Blurred Vision  . Numbness    face    (Consider location/radiation/quality/duration/timing/severity/associated sxs/prior treatment) The history is provided by the patient.   36 year old female states that she had numbness in the right side of her face that started about 8 AM. Numbness is persistent. It is getting worse no better. She denies any numbness to the left side of her face and denies any numbness on her trunk or arms or legs. There is no weakness. She has not had any difficulty with her speech. She has had some aching in her legs and in her hands for the last week for which she has been taking Goody powder. She's had nausea during this time but has only vomited after taking Goody powder and this has not helped her pain. She has noted some blurring of her vision and polyuria and polydipsia. Of note, she has a history of gestational diabetes and has not checked her blood sugar. There is a family history of diabetes as well. She's not had any fever, chills, sweats. She denies any headache.  Past Medical History  Diagnosis Date  . No pertinent past medical history   . Diabetes mellitus during pregnany  . Hypertension during pregnancy  . Pre-eclampsia     Past Surgical History  Procedure Date  . Salpingectomy   . Tubal ligation   . Breast surgery 2003    reduction    Family History  Problem Relation Age of Onset  . Anesthesia problems Neg Hx     History  Substance Use Topics  . Smoking status: Current Everyday Smoker    Types: Cigarettes  . Smokeless tobacco: Not on file  . Alcohol Use: No    OB History    Grav Para Term Preterm Abortions TAB SAB Ect Mult Living   3 2 2  0 1 0 0 1 0 2      Review of Systems  All other systems reviewed and are negative.    Allergies  Review of patient's  allergies indicates no known allergies.  Home Medications   Current Outpatient Rx  Name Route Sig Dispense Refill  . GOODY HEADACHE PO Oral Take 1 packet by mouth daily as needed. For body aches      BP 151/93  Pulse 85  Temp(Src) 98.7 F (37.1 C) (Oral)  Resp 18  SpO2 97%  LMP 10/19/2011  Physical Exam  Nursing note and vitals reviewed.  36 year old female is resting comfortably in no acute distress. Vital signs are significant for mild hypertension with blood pressure 151/93. Oxygen saturation is 97% which is normal. Head is normocephalic and atraumatic. PERRLA, EOMI. Fundi show no hemorrhage, exudate, or papilledema. There is no facial asymmetry and tongue protrudes in the midline. Neck is nontender and supple without adenopathy or bruit. Back is nontender. Lungs are clear without rales, wheezes, or rhonchi. Heart has regular rate and rhythm without murmur. Abdomen is soft, flat, nontender without masses or hepatosplenomegaly. Extremities have full range of motion, no cyanosis or edema, pulses are strong. Skin is warm and dry without rash. Neurologic: Mental status is normal, cranial nerves are normal. Careful motor and sensory exam is completely normal. There is no pronator drift and no extinction on double simultaneous stimulation.  ED Course  Procedures (including critical care time)  Results for orders placed during the hospital encounter of 10/29/11  CBC      Component Value Range   WBC 10.5  4.0 - 10.5 (K/uL)   RBC 4.82  3.87 - 5.11 (MIL/uL)   Hemoglobin 12.4  12.0 - 15.0 (g/dL)   HCT 16.1 (*) 09.6 - 46.0 (%)   MCV 74.3 (*) 78.0 - 100.0 (fL)   MCH 25.7 (*) 26.0 - 34.0 (pg)   MCHC 34.6  30.0 - 36.0 (g/dL)   RDW 04.5  40.9 - 81.1 (%)   Platelets 332  150 - 400 (K/uL)  DIFFERENTIAL      Component Value Range   Neutrophils Relative 53  43 - 77 (%)   Lymphocytes Relative 35  12 - 46 (%)   Monocytes Relative 6  3 - 12 (%)   Eosinophils Relative 5  0 - 5 (%)   Basophils  Relative 1  0 - 1 (%)   Neutro Abs 5.6  1.7 - 7.7 (K/uL)   Lymphs Abs 3.7  0.7 - 4.0 (K/uL)   Monocytes Absolute 0.6  0.1 - 1.0 (K/uL)   Eosinophils Absolute 0.5  0.0 - 0.7 (K/uL)   Basophils Absolute 0.1  0.0 - 0.1 (K/uL)   WBC Morphology MILD LEFT SHIFT (1-5% METAS, OCC MYELO, OCC BANDS)    BASIC METABOLIC PANEL      Component Value Range   Sodium 139  135 - 145 (mEq/L)   Potassium 3.7  3.5 - 5.1 (mEq/L)   Chloride 102  96 - 112 (mEq/L)   CO2 27  19 - 32 (mEq/L)   Glucose, Bld 76  70 - 99 (mg/dL)   BUN 6  6 - 23 (mg/dL)   Creatinine, Ser 9.14  0.50 - 1.10 (mg/dL)   Calcium 9.1  8.4 - 78.2 (mg/dL)   GFR calc non Af Amer >90  >90 (mL/min)   GFR calc Af Amer >90  >90 (mL/min)  URINALYSIS, ROUTINE W REFLEX MICROSCOPIC      Component Value Range   Color, Urine YELLOW  YELLOW    APPearance CLEAR  CLEAR    Specific Gravity, Urine 1.011  1.005 - 1.030    pH 7.0  5.0 - 8.0    Glucose, UA NEGATIVE  NEGATIVE (mg/dL)   Hgb urine dipstick NEGATIVE  NEGATIVE    Bilirubin Urine NEGATIVE  NEGATIVE    Ketones, ur NEGATIVE  NEGATIVE (mg/dL)   Protein, ur NEGATIVE  NEGATIVE (mg/dL)   Urobilinogen, UA 1.0  0.0 - 1.0 (mg/dL)   Nitrite POSITIVE (*) NEGATIVE    Leukocytes, UA TRACE (*) NEGATIVE   URINE MICROSCOPIC-ADD ON      Component Value Range   Squamous Epithelial / LPF RARE  RARE    WBC, UA 0-2  <3 (WBC/hpf)   Bacteria, UA MANY (*) RARE     1. Numbness   2. Urinary tract infection   3. Nausea       MDM  Facial numbness of uncertain cause. I suspect that she is a new-onset diabetic and I would account for the blurred vision. Blood sugar will be checked and she'll be given a dose of metoclopramide and reevaluated.  Blood sugars come back normal. Urinalysis shows evidence of urinary tract infection so she is started on antibiotics and is given a dose of cephalexin. After the metoclopramide, nausea is improved but paresthesias are unchanged. She does not show any abnormalities on her  direct neurologic exam but she will be referred to  [facility] for further evaluation. She is to return to the emergency department is symptoms worsen.    Dione Booze, MD 10/29/11 [phone]

## 2011-10-29 NOTE — ED Notes (Signed)
Dr. Preston Fleeting notified that IV access cannot be obtained.  Dr. Preston Fleeting gave verbal order to change meds to IM route.

## 2011-10-29 NOTE — ED Notes (Signed)
Pt reports not feeling well all week with body aches. Pt has been using goody powder. Today pt woke up with facial numbness and blurred vision.

## 2011-11-01 LAB — URINE CULTURE: Culture  Setup Time: 201306101122

## 2011-11-03 NOTE — ED Notes (Signed)
+  Urine. Patient treated with Keflex. Sensitive to same. Per protocol MD. °

## 2012-12-04 ENCOUNTER — Other Ambulatory Visit: Payer: Self-pay | Admitting: Internal Medicine

## 2012-12-04 DIAGNOSIS — N63 Unspecified lump in unspecified breast: Secondary | ICD-10-CM

## 2012-12-05 ENCOUNTER — Other Ambulatory Visit: Payer: Self-pay | Admitting: Internal Medicine

## 2012-12-05 DIAGNOSIS — N63 Unspecified lump in unspecified breast: Secondary | ICD-10-CM

## 2012-12-17 ENCOUNTER — Ambulatory Visit
Admission: RE | Admit: 2012-12-17 | Discharge: 2012-12-17 | Disposition: A | Discharge status: Home / Self Care | Payer: No Typology Code available for payment source | Source: Ambulatory Visit | Attending: Internal Medicine | Admitting: Internal Medicine

## 2012-12-17 DIAGNOSIS — N63 Unspecified lump in unspecified breast: Secondary | ICD-10-CM

## 2014-03-23 ENCOUNTER — Encounter (HOSPITAL_COMMUNITY): Payer: Self-pay | Admitting: Emergency Medicine

## 2018-10-06 ENCOUNTER — Encounter (HOSPITAL_COMMUNITY): Payer: Self-pay

## 2018-10-06 ENCOUNTER — Emergency Department (HOSPITAL_COMMUNITY): Payer: Self-pay

## 2018-10-06 ENCOUNTER — Emergency Department (HOSPITAL_COMMUNITY)
Admission: EM | Admit: 2018-10-06 | Discharge: 2018-10-06 | Disposition: A | Discharge status: Home / Self Care | Payer: Self-pay | Attending: Emergency Medicine | Admitting: Emergency Medicine

## 2018-10-06 ENCOUNTER — Other Ambulatory Visit: Payer: Self-pay

## 2018-10-06 DIAGNOSIS — Y929 Unspecified place or not applicable: Secondary | ICD-10-CM | POA: Insufficient documentation

## 2018-10-06 DIAGNOSIS — Y939 Activity, unspecified: Secondary | ICD-10-CM | POA: Insufficient documentation

## 2018-10-06 DIAGNOSIS — M25562 Pain in left knee: Secondary | ICD-10-CM | POA: Insufficient documentation

## 2018-10-06 DIAGNOSIS — Y999 Unspecified external cause status: Secondary | ICD-10-CM | POA: Insufficient documentation

## 2018-10-06 DIAGNOSIS — W2209XA Striking against other stationary object, initial encounter: Secondary | ICD-10-CM | POA: Insufficient documentation

## 2018-10-06 DIAGNOSIS — E119 Type 2 diabetes mellitus without complications: Secondary | ICD-10-CM | POA: Insufficient documentation

## 2018-10-06 DIAGNOSIS — F17210 Nicotine dependence, cigarettes, uncomplicated: Secondary | ICD-10-CM | POA: Insufficient documentation

## 2018-10-06 DIAGNOSIS — I10 Essential (primary) hypertension: Secondary | ICD-10-CM | POA: Insufficient documentation

## 2018-10-06 NOTE — ED Notes (Signed)
Patient verbalizes understanding of discharge instructions . Opportunity for questions and answers were provided . Armband removed by staff ,Pt discharged from ED. W/C  offered at D/C  and Declined W/C at D/C and was escorted to lobby by RN.  

## 2018-10-06 NOTE — Discharge Instructions (Signed)
It was my pleasure taking care of you today!   Alternate between Tylenol and Ibuprofen as needed for pain. Ice and elevate knee throughout the day.  Call the orthopedist listed if symptoms do not improve in one week to schedule a follow up appointment.  Return to the ER for new or worsening symptoms, any additional concerns.

## 2018-10-06 NOTE — ED Triage Notes (Signed)
Pt presents for left leg pain since Monday. Pt states she hit her leg on a trailer hitch. Pt endorses difficulty with ambulation and decreased sensation to extremity. Pt legs warm to tough bilaterally. Pt has full ROM to both lower extremities.

## 2018-10-06 NOTE — ED Provider Notes (Signed)
[facility]   CSN: [id number] Arrival date & time: 10/06/18  1623    History   Chief Complaint Chief Complaint  Patient presents with  . Leg Pain  . Leg Injury    HPI [name] is a 43 y.o. female.     The history is provided by the patient and medical records. No language interpreter was used.  Leg Pain   [name] is a 43 y.o. female  with a PMH of HTN, DM who presents to the Emergency Department complaining of left knee pain which began acutely on Monday after she hit the knee on a trailer hitch of a [employer].  She states that pain is worse with certain movements, especially ambulation.  She thought that the pain would get better by now, but it has been persistent.  It did improve a little bit on Friday when she took the full day to rest, but yesterday when she started moving around again, pain returned.  She states that she has diabetes and does have some baseline neuropathy in both of her feet.  She states that the decreased sensation as noted in triage note is not new since the injury.  She states that she has had decreased sensation for several years and she is on Trulicity for her diabetes to help with this.  She had no new sensory changes since the injury.  Past Medical History:  Diagnosis Date  . Diabetes mellitus during pregnany  . Hypertension during pregnancy  . No pertinent past medical history   . Pre-eclampsia     There are no active problems to display for this patient.   Past Surgical History:  Procedure Laterality Date  . BREAST SURGERY  2003   reduction  . SALPINGECTOMY    . TUBAL LIGATION       OB History    Gravida  3   Para  2   Term  2   Preterm  0   AB  1   Living  2     SAB  0   TAB  0   Ectopic  1   Multiple  0   Live Births               Home Medications    Prior to Admission medications   Medication Sig Start Date End Date Taking? Authorizing  Provider  Aspirin-Acetaminophen-Caffeine (GOODY HEADACHE PO) Take 1 packet by mouth daily as needed. For body aches    [provider]  metoCLOPramide (REGLAN) 10 MG tablet Take 1 tablet (10 mg total) by mouth every 6 (six) hours as needed (nausea). 10/29/11 11/08/11  Dione BoozeGlick, David, MD    Family History Family History  Problem Relation Age of Onset  . Anesthesia problems Neg Hx     Social History Social History   Tobacco Use  . Smoking status: Current Every Day Smoker    Types: Cigarettes  Substance Use Topics  . Alcohol use: No  . Drug use: No     Allergies   Patient has no known allergies.   Review of Systems Review of Systems  Cardiovascular: Negative for leg swelling.  Musculoskeletal: Positive for arthralgias and myalgias.  Skin: Negative for color change and wound.  Neurological: Positive for numbness ( Neuropathy, no new sensory changes.). Negative for weakness.     Physical Exam Updated Vital Signs BP (!) 147/102 (BP Location: Right Arm)   Pulse (!) 101  Temp 99.3 F (37.4 C) (Oral)   Ht 5\' 6"  (1.676 m)   Wt 85.7 kg   LMP 10/03/2018   SpO2 100%   BMI 30.51 kg/m   Physical Exam Vitals signs and nursing note reviewed.  Constitutional:      General: She is not in acute distress.    Appearance: She is well-developed.  HENT:     Head: Normocephalic and atraumatic.  Neck:     Musculoskeletal: Neck supple.  Cardiovascular:     Rate and Rhythm: Normal rate and regular rhythm.     Heart sounds: Normal heart sounds. No murmur.  Pulmonary:     Effort: Pulmonary effort is normal. No respiratory distress.     Breath sounds: Normal breath sounds.  Abdominal:     General: There is no distension.     Palpations: Abdomen is soft.     Tenderness: There is no abdominal tenderness.  Musculoskeletal:     Comments: TTP of the anterior superior left knee. Full ROM. No joint line tenderness. No joint effusion or swelling appreciated. No abnormal alignment  or patellar mobility. No bruising, erythema or warmth overlaying the joint. No varus/valgus laxity. Negative drawer's, Lachman's and McMurray's.  No crepitus. 2+ DP pulses bilaterally. All compartments are soft. Sensation intact distal to injury.  Skin:    General: Skin is warm and dry.  Neurological:     Mental Status: She is alert and oriented to person, place, and time.      ED Treatments / Results  Labs (all labs ordered are listed, but only abnormal results are displayed) Labs Reviewed - No data to display  EKG None  Radiology Dg Knee Complete 4 Views Left  Result Date: 10/06/2018 CLINICAL DATA:  Injury to left knee 6 days ago. EXAM: LEFT KNEE - COMPLETE 4+ VIEW COMPARISON:  None FINDINGS: No joint effusion. Sharpening of the tibial spines. Mild tricompartment marginal spur formation. No radiopaque foreign bodies or soft tissue calcifications. IMPRESSION: 1. No acute findings. 2. Mild osteoarthritis. Electronically Signed   By: Signa Kell M.D.   On: 10/06/2018 17:55    Procedures Procedures (including critical care time)  Medications Ordered in ED Medications - No data to display   Initial Impression / Assessment and Plan / ED Course  I have reviewed the triage vital signs and the nursing notes.  Pertinent labs & imaging results that were available during my care of the patient were reviewed by me and considered in my medical decision making (see chart for details).       [name] is a 43 y.o. female who presents to ED for knee pain after striking knee on a trailer hitch a few days ago. NVI on exam. Ligaments intact. Ambulatory. X-ray without acute findings. Will treat symptomatically and have patient follow up with ortho if symptoms are not improving. Discussed home care instructions including RICE / NSAIDS. Return precautions discussed and all questions answered.   Final Clinical Impressions(s) / ED Diagnoses   Final diagnoses:  Acute pain of left knee     ED Discharge Orders    None       Delane Stalling, Chase Picket, PA-C 10/06/18 1809    Derwood Kaplan, MD 10/08/18 2135

## 2018-10-06 NOTE — ED Notes (Signed)
Pt transported to xray 

## 2018-12-11 ENCOUNTER — Other Ambulatory Visit: Payer: Self-pay

## 2018-12-11 DIAGNOSIS — Z20822 Contact with and (suspected) exposure to covid-19: Secondary | ICD-10-CM

## 2018-12-14 LAB — NOVEL CORONAVIRUS, NAA: SARS-CoV-2, NAA: NOT DETECTED

## 2020-06-24 IMAGING — DX LEFT KNEE - COMPLETE 4+ VIEW
4 series · 4 of 4 positions shown · non-contrast
Comparison: None

CLINICAL DATA: Injury to left knee 6 days ago.

EXAM:
LEFT KNEE - COMPLETE 4+ VIEW

[knee ap]
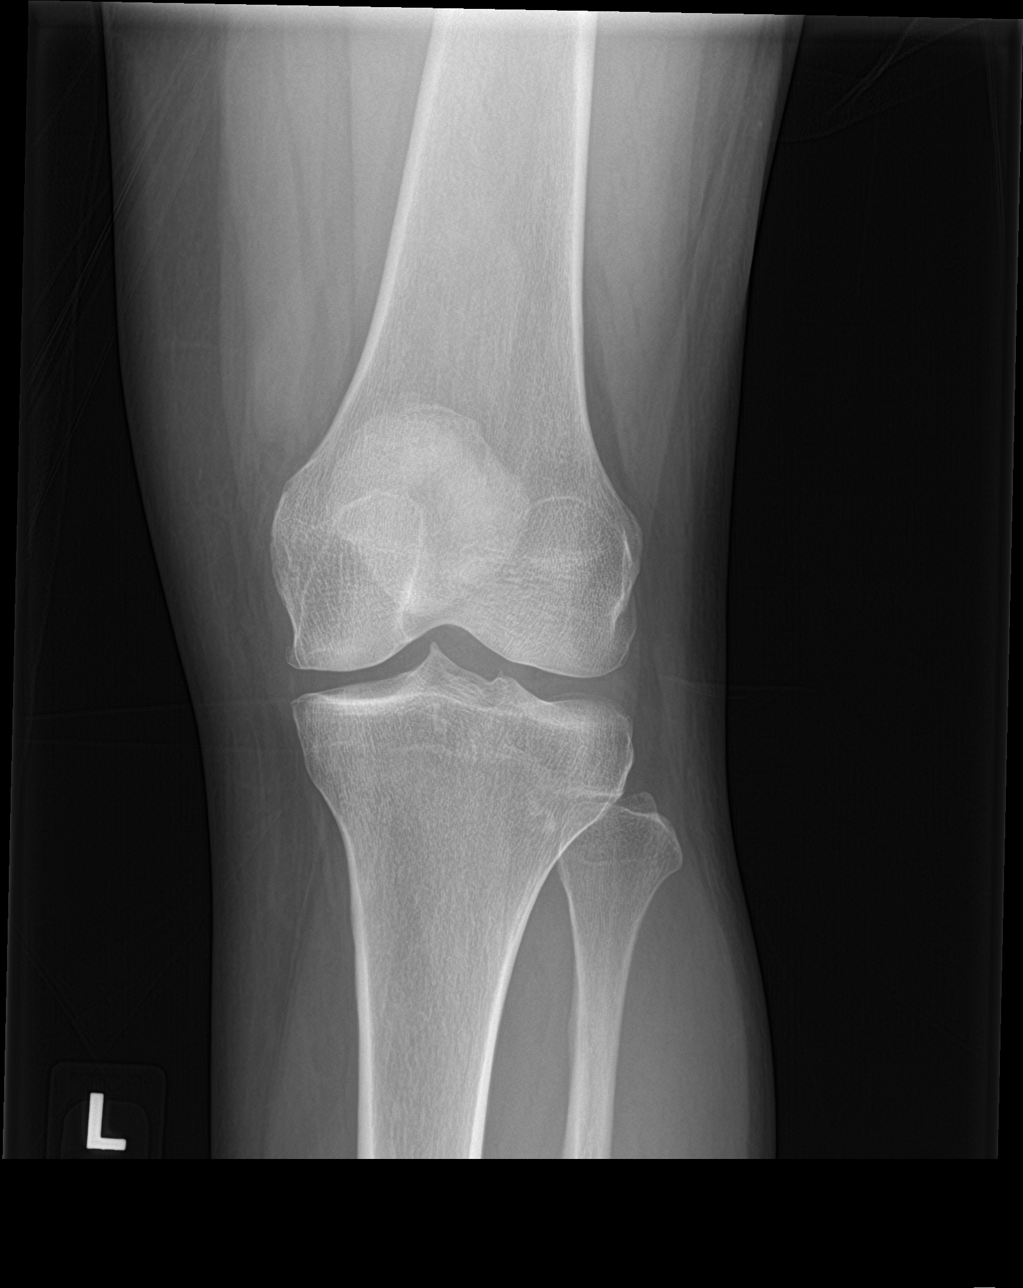

[knee lat]
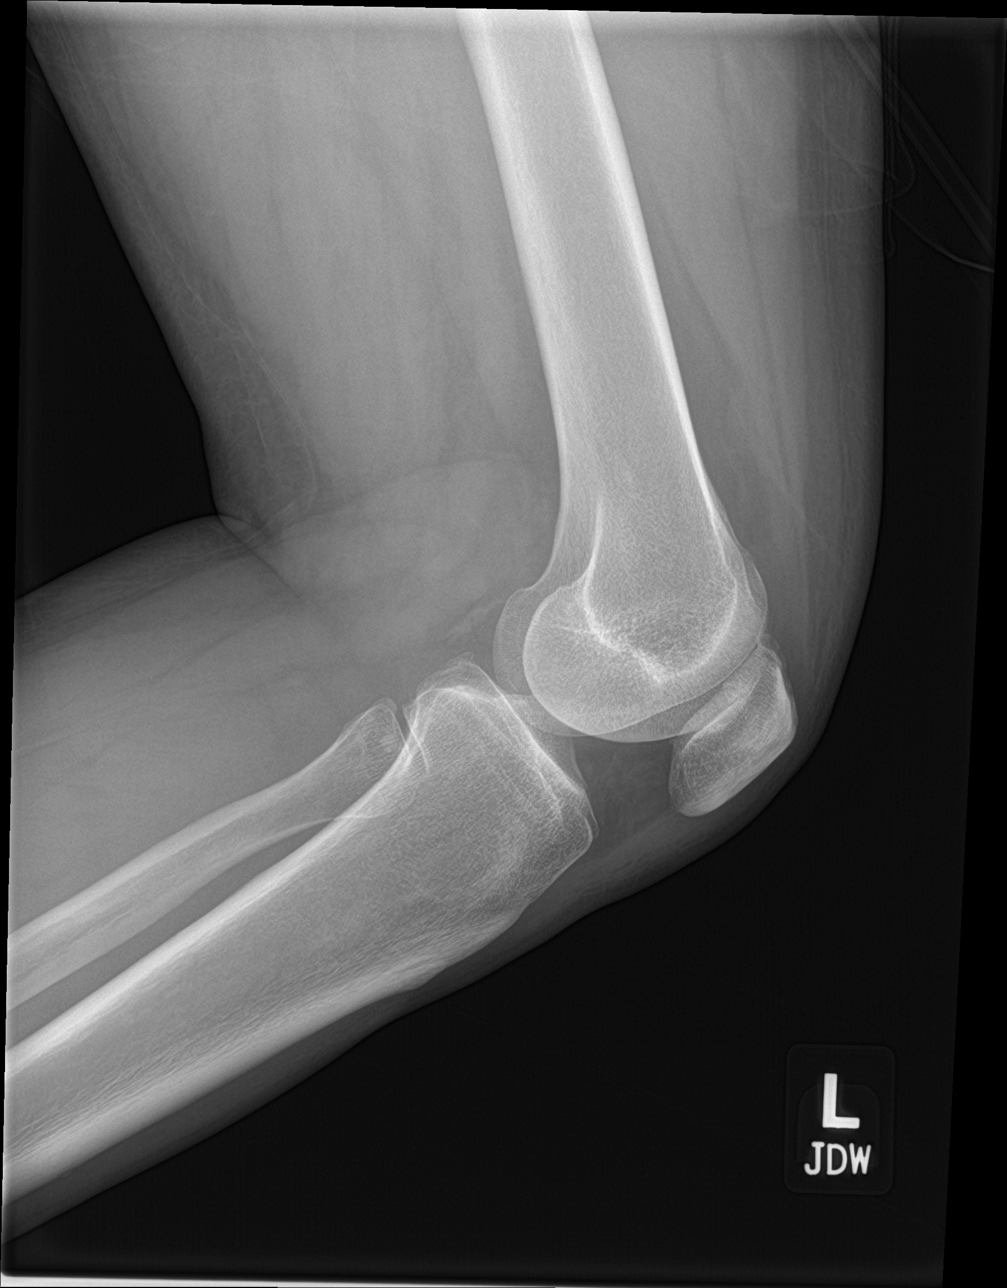

[knee obl (1 of 2)]
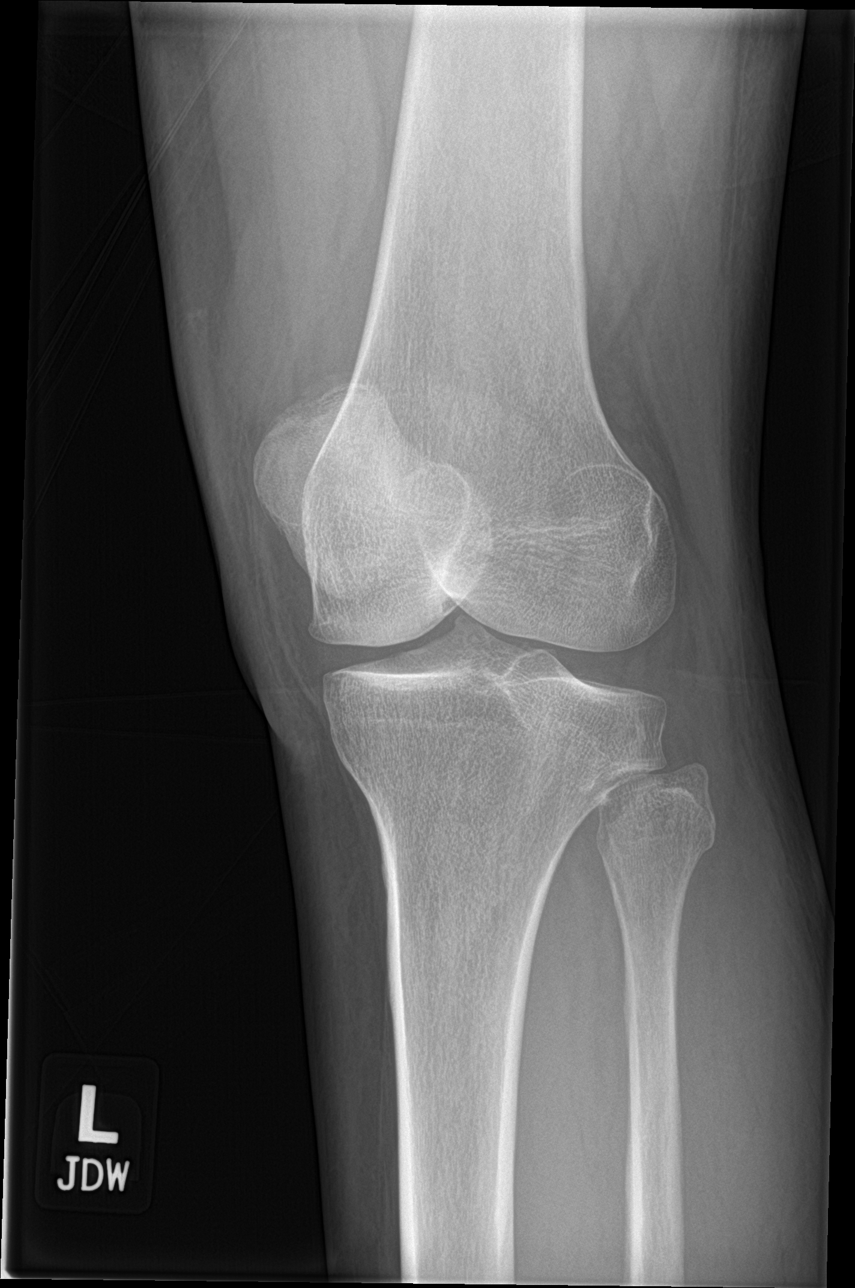

[knee obl (2 of 2)]
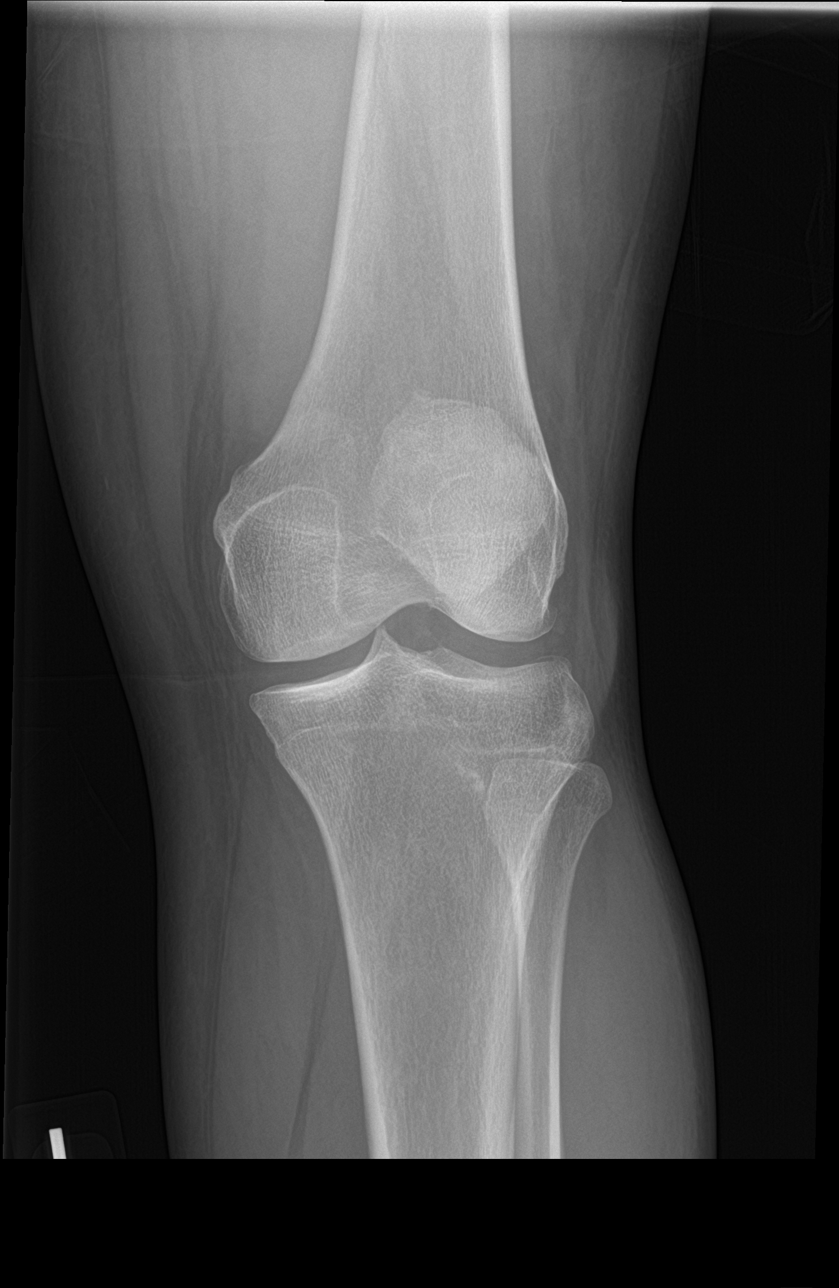

[4 of 4 positions shown; findings below may reference images not displayed]

FINDINGS: No joint effusion. Sharpening of the tibial spines. Mild
tricompartment marginal spur formation. No radiopaque foreign bodies
or soft tissue calcifications.
IMPRESSION: 1. No acute findings.
2. Mild osteoarthritis.

## 2023-05-31 ENCOUNTER — Other Ambulatory Visit: Payer: Self-pay | Admitting: Nephrology

## 2023-05-31 DIAGNOSIS — N1831 Chronic kidney disease, stage 3a: Secondary | ICD-10-CM

## 2023-06-04 ENCOUNTER — Ambulatory Visit
Admission: RE | Admit: 2023-06-04 | Discharge: 2023-06-04 | Discharge status: Home / Self Care | Payer: Self-pay | Source: Ambulatory Visit | Attending: Nephrology | Admitting: Nephrology

## 2023-06-04 DIAGNOSIS — N1831 Chronic kidney disease, stage 3a: Secondary | ICD-10-CM

## 2023-06-05 ENCOUNTER — Other Ambulatory Visit: Payer: Self-pay | Admitting: Nephrology

## 2023-06-05 DIAGNOSIS — N1831 Chronic kidney disease, stage 3a: Secondary | ICD-10-CM
